# Patient Record
Sex: Female | Born: 2000 | Race: Black or African American | Hispanic: No | Marital: Single | Smoking: Never smoker
Health system: Southern US, Community
[De-identification: ages and names within clinical notes are randomized; demographics above are authoritative.]

## PROBLEM LIST (undated history)

## (undated) DIAGNOSIS — D573 Sickle-cell trait: Secondary | ICD-10-CM

## (undated) DIAGNOSIS — L0291 Cutaneous abscess, unspecified: Secondary | ICD-10-CM

## (undated) HISTORY — DX: Sickle-cell trait: D57.3

## (undated) HISTORY — PX: TONSILLECTOMY: SUR1361

---
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
## 2018-02-18 LAB — HM HIV SCREENING LAB: HM HIV Screening: NEGATIVE

## 2019-03-22 ENCOUNTER — Ambulatory Visit: Payer: Self-pay

## 2019-03-31 ENCOUNTER — Other Ambulatory Visit: Payer: Self-pay

## 2019-03-31 ENCOUNTER — Ambulatory Visit: Payer: Self-pay | Admitting: Physician Assistant

## 2019-03-31 ENCOUNTER — Ambulatory Visit: Payer: Self-pay

## 2019-03-31 DIAGNOSIS — Z202 Contact with and (suspected) exposure to infections with a predominantly sexual mode of transmission: Secondary | ICD-10-CM

## 2019-03-31 DIAGNOSIS — Z113 Encounter for screening for infections with a predominantly sexual mode of transmission: Secondary | ICD-10-CM

## 2019-03-31 LAB — WET PREP FOR TRICH, YEAST, CLUE
Trichomonas Exam: NEGATIVE
Yeast Exam: NEGATIVE

## 2019-03-31 MED ORDER — AZITHROMYCIN 500 MG PO TABS
1000.0000 mg | ORAL_TABLET | Freq: Once | ORAL | Status: AC
  Administered 2019-03-31: 1000 mg via ORAL

## 2019-04-01 ENCOUNTER — Encounter: Payer: Self-pay | Admitting: Physician Assistant

## 2019-04-01 NOTE — Progress Notes (Signed)
[facility] Department STI clinic/screening visit  Subjective:  Sheila Bailey is a 19 y.o. female being seen today for an STI screening visit. The patient reports they do not have symptoms.  Patient reports that they do not desire a pregnancy in the next year.   They reported they are not interested in discussing contraception today.  No LMP recorded.   Patient has the following medical conditions:  There are no problems to display for this patient.   Chief Complaint  Patient presents with  . SEXUALLY TRANSMITTED DISEASE    HPI  Patient reports that she is not having any symptoms but would like a screening.  Reports that she is a contact to Chlamydia.  States that she uses Depo as BCM and does not have periods with the Depo.  See flowsheet for further details and programmatic requirements.    The following portions of the patient's history were reviewed and updated as appropriate: allergies, current medications, past medical history, past social history, past surgical history and problem list.  Objective:  There were no vitals filed for this visit.  Physical Exam Constitutional:      General: She is not in acute distress.    Appearance: Normal appearance. She is normal weight.  HENT:     Head: Normocephalic and atraumatic.     Comments: No nits, lice, or hair loss. No cervical, supraclavicular, or axillary adenopathy.    Mouth/Throat:     Mouth: Mucous membranes are moist.     Pharynx: Oropharynx is clear. No oropharyngeal exudate or posterior oropharyngeal erythema.  Eyes:     Conjunctiva/sclera: Conjunctivae normal.  Pulmonary:     Effort: Pulmonary effort is normal.  Abdominal:     Palpations: Abdomen is soft. There is no mass.     Tenderness: There is no abdominal tenderness. There is no guarding or rebound.  Genitourinary:    General: Normal vulva.     Rectum: Normal.     Comments: External genitalia/pubic area without nits, lice, edema, erythema,  lesions and inguinal adenopathy. Vagina with normal mucosa and discharge. Cervix without visible lesions. Uterus firm, mobile, nt, no masses, no CMT, no adnexal tenderness or fullness. Musculoskeletal:     Cervical back: Neck supple. No tenderness.  Skin:    General: Skin is warm and dry.     Findings: No bruising, erythema, lesion or rash.  Neurological:     Mental Status: She is alert and oriented to person, place, and time.  Psychiatric:        Mood and Affect: Mood normal.        Behavior: Behavior normal.        Thought Content: Thought content normal.        Judgment: Judgment normal.      Assessment and Plan:  Sheila Bailey is a 19 y.o. female presenting to the [facility] Department for STI screening  1. Screening for STD (sexually transmitted disease) Patient into clinic without symptoms. Rec condoms with all sex. Await test results.  Counseled that RN will call if needs to RTC for further treatment once results are back.  - WET PREP FOR TRICH, YEAST, CLUE - Chlamydia/Gonorrhea [place] [facility] - HIV [place] [facility] - Syphilis Serology, [place] [facility]  2. Chlamydia contact Will treat as a contact to Chlamydia with Azithromycin 1 g po DOT today. No sex for 7 days and until after partner completes treatment. RTC for re-treatment if vomits < 2 hr after  taking medicine. - azithromycin (ZITHROMAX) tablet 1,000 mg     No follow-ups on file.  No future appointments.  Matt Holmes, PA

## 2019-04-07 ENCOUNTER — Ambulatory Visit: Payer: Self-pay

## 2019-04-07 ENCOUNTER — Other Ambulatory Visit: Payer: Self-pay

## 2019-04-07 DIAGNOSIS — Z202 Contact with and (suspected) exposure to infections with a predominantly sexual mode of transmission: Secondary | ICD-10-CM

## 2019-04-07 MED ORDER — AZITHROMYCIN 500 MG PO TABS
1000.0000 mg | ORAL_TABLET | Freq: Once | ORAL | Status: AC
  Administered 2019-04-07: 1000 mg via ORAL

## 2019-04-07 NOTE — Progress Notes (Signed)
Patient vomited within 2 hours after leaving her last visit.  Here for re-treatment d/t contact to chlamydia. Richmond Campbell, RN

## 2019-04-08 ENCOUNTER — Telehealth: Payer: Self-pay

## 2019-04-08 ENCOUNTER — Ambulatory Visit: Payer: Self-pay

## 2019-04-08 DIAGNOSIS — A549 Gonococcal infection, unspecified: Secondary | ICD-10-CM

## 2019-04-10 MED ORDER — CEFTRIAXONE SODIUM 250 MG IJ SOLR
250.0000 mg | Freq: Once | INTRAMUSCULAR | Status: AC
  Administered 2019-04-08: 250 mg via INTRAMUSCULAR

## 2019-04-10 NOTE — Progress Notes (Signed)
Patient received + GC results 04/08/19. On 04/07/19 patient was re-treated for contact to Chlamydia after vomiting medication a week prior.  RN consulted with provider re: tx.  Patient given Ceftriaxone only today (04/08/19) d/t having AZMCN <24 hours. Tolerated tx well Richmond Campbell, RN

## 2019-04-10 NOTE — Telephone Encounter (Signed)
TC to patient. Verified ID via password/SS#. Informed of positive GC and need for tx. Instructed to eat before visit and have partner call for tx appt. Appt scheduled 04/08/19. Patient previously tx'd for contact to chlamydia and re-treated on 04/07/19 after vomiting AZMCN the previous week after her provider visit. Per C. Hampton PA patient only needs ceftriaxone if she returns to clinic within 24 hours AZMCN tx. Sheila Campbell, RN

## 2019-09-09 ENCOUNTER — Ambulatory Visit: Payer: BC Managed Care – PPO

## 2019-09-12 ENCOUNTER — Ambulatory Visit: Payer: BC Managed Care – PPO

## 2019-09-13 ENCOUNTER — Ambulatory Visit: Payer: BC Managed Care – PPO

## 2019-09-13 ENCOUNTER — Ambulatory Visit (LOCAL_COMMUNITY_HEALTH_CENTER): Payer: BC Managed Care – PPO | Admitting: Advanced Practice Midwife

## 2019-09-13 ENCOUNTER — Telehealth: Payer: Self-pay

## 2019-09-13 ENCOUNTER — Encounter: Payer: Self-pay | Admitting: Advanced Practice Midwife

## 2019-09-13 ENCOUNTER — Other Ambulatory Visit: Payer: Self-pay

## 2019-09-13 VITALS — BP 119/60 | Ht 65.0 in | Wt 156.8 lb

## 2019-09-13 DIAGNOSIS — A549 Gonococcal infection, unspecified: Secondary | ICD-10-CM | POA: Insufficient documentation

## 2019-09-13 DIAGNOSIS — Z3009 Encounter for other general counseling and advice on contraception: Secondary | ICD-10-CM

## 2019-09-13 DIAGNOSIS — Z30013 Encounter for initial prescription of injectable contraceptive: Secondary | ICD-10-CM

## 2019-09-13 DIAGNOSIS — B9689 Other specified bacterial agents as the cause of diseases classified elsewhere: Secondary | ICD-10-CM

## 2019-09-13 DIAGNOSIS — J45909 Unspecified asthma, uncomplicated: Secondary | ICD-10-CM

## 2019-09-13 DIAGNOSIS — E663 Overweight: Secondary | ICD-10-CM | POA: Insufficient documentation

## 2019-09-13 LAB — WET PREP FOR TRICH, YEAST, CLUE
Trichomonas Exam: NEGATIVE
Yeast Exam: NEGATIVE

## 2019-09-13 MED ORDER — MEDROXYPROGESTERONE ACETATE 150 MG/ML IM SUSP
150.0000 mg | Freq: Once | INTRAMUSCULAR | Status: AC
  Administered 2019-09-13: 150 mg via INTRAMUSCULAR

## 2019-09-13 MED ORDER — METRONIDAZOLE 500 MG PO TABS: 500.0000 mg | ORAL_TABLET | Freq: Two times a day (BID) | ORAL | 0 refills | Status: AC

## 2019-09-13 NOTE — Progress Notes (Signed)
Wet mount reviewed, patient treated for BV per SO. Patient given Depo today per provider orders. Next Depo card given and consent signed.Burt Knack, RN

## 2019-09-13 NOTE — Progress Notes (Signed)
Family Planning Visit- Initial Visit  Subjective:  Sheila Bailey is a 19 y.o.  G0P0000 nonsmoker  being seen today for an initial well woman visit and to discuss family planning options.  She is currently using DMPA for pregnancy prevention. Patient reports she does not want a pregnancy in the next year.  Patient has the following medical conditions has Overweight BMI=26.0 on their problem list.  Chief Complaint  Patient presents with  . Contraception    Depo  . Annual Exam    Patient reports last DMPA 06/07/19 at [facility] and wants another DMPA.  +GC 04/08/19.  No LMP on DMPA.  Last sex 08/23/19 without condom; with current partner x 2 mo; 1 partner in last 3 mo; 2 partners in last 12 mo.  Last ETOH 09/04/19 (1 wine cooler) 2x/mo.  Not working and not in school.  Living with her parents.  Asthma dx'd 2 years ago.  Last PE 02/19/18.  +cry 2-3x/wk, sleep wnl, appetite poor, +irritable, -HI/SI.  No formal psych dx but PHQ-9=16 and agrees to see Kathreen Cosier, LCSW.  Patient denies SI/HI but "I had a bad night last night"  Body mass index is 26.09 kg/m. - Patient is eligible for diabetes screening based on BMI and age >49?  not applicable HA1C ordered? not applicable  Patient reports 2 of partners in last year. Desires STI screening?  Yes  Has patient been screened once for HCV in the past?  No  No results found for: HCVAB  Does the patient have current of drug use, have a partner with drug use, and/or has been incarcerated since last result? No  If yes-- Screen for HCV through [facility]   Does the patient meet criteria for HBV testing? No  Criteria:  -Household, sexual or needle sharing contact with HBV -History of drug use -HIV positive -Those with known Hep C   Health Maintenance Due  Topic Date Due  . Hepatitis C Screening  Never done  . CHLAMYDIA SCREENING  Never done  . TETANUS/TDAP  Never done    Review of Systems  All other systems reviewed and are  negative.   The following portions of the patient's history were reviewed and updated as appropriate: allergies, current medications, past family history, past medical history, past social history, past surgical history and problem list. Problem list updated.   See flowsheet for other program required questions.  Objective:   Vitals:   09/13/19 1457  BP: 119/60  Weight: 156 lb 12.8 oz (71.1 kg)  Height: 5\' 5"  (1.651 m)    Physical Exam Constitutional:      Appearance: Normal appearance. She is normal weight.  HENT:     Head: Normocephalic and atraumatic.     Mouth/Throat:     Mouth: Mucous membranes are moist.  Eyes:     Conjunctiva/sclera: Conjunctivae normal.  Cardiovascular:     Rate and Rhythm: Normal rate and regular rhythm.  Pulmonary:     Effort: Pulmonary effort is normal.     Breath sounds: Normal breath sounds.  Chest:     Breasts:        Right: Normal.        Left: Normal.  Abdominal:     Palpations: Abdomen is soft.     Comments: Soft without tenderness, good tone  Genitourinary:    General: Normal vulva.     Exam position: Lithotomy position.     Vagina: Vaginal discharge (moderate white creamy sl malodorous  leukorrhea, ph>4.5) present.     Cervix: Normal.     Uterus: Normal.      Adnexa: Right adnexa normal and left adnexa normal.     Rectum: Normal.     Comments: Perineum with sl hypopigmentation Musculoskeletal:        General: Normal range of motion.     Cervical back: Normal range of motion and neck supple.  Skin:    General: Skin is warm and dry.  Neurological:     Mental Status: She is alert.  Psychiatric:        Mood and Affect: Mood normal.       Assessment and Plan:  Sheila Bailey is a 19 y.o. female presenting to the [facility] Department for an initial well woman exam/family planning visit  Contraception counseling: Reviewed all forms of birth control options in the tiered based approach. available including  abstinence; over the counter/barrier methods; hormonal contraceptive medication including pill, patch, ring, injection,contraceptive implant, ECP; hormonal and nonhormonal IUDs; permanent sterilization options including vasectomy and the various tubal sterilization modalities. Risks, benefits, and typical effectiveness rates were reviewed.  Questions were answered.  Written information was also given to the patient to review.  Patient desires DMPA, this was prescribed for patient. She will follow up in 11-13 weeks for surveillance.  She was told to call with any further questions, or with any concerns about this method of contraception.  Emphasized use of condoms 100% of the time for STI prevention.  Patient was offered ECP. ECP was not accepted by the patient. ECP counseling was not given - see RN documentation  1. Overweight BMI=26.0   2. Family planning Treat wet mount per standing orders Immunization nurse consult Please give primary care MD list to pt Please give pt [name]'s contact info  - WET PREP FOR TRICH, YEAST, CLUE - Chlamydia/Gonorrhea [place] [facility] - Syphilis Serology, [place] [facility] - HIV [place] [facility]  3. Encounter for initial prescription of injectable contraceptive DMPA 150 mg IM q 11-13 wks x 1 year Please counsel on need for backup condoms to decrease STD risk     No follow-ups on file.  No future appointments.  Alberteen Spindle, CNM

## 2019-09-13 NOTE — Progress Notes (Signed)
ROI for last Depo faxed to [facility]. TC to [employer] and spoke with Porfirio Mylar who states patient's last Depo was given 06/07/2019. Patient is 14 weeks since last Depo.Burt Knack, RN

## 2019-09-13 NOTE — Telephone Encounter (Signed)
Client has appt in Nurse Clinic this pm for Depo, but no current physical and no documentation of Depo administration in Epic. Call to client and agreeabe to scheduling appt for physical this pm. Per client, thinks last Depo was in April at [facility]. Jossie Ng, RN

## 2019-09-13 NOTE — Progress Notes (Signed)
Patient here for PE and Depo. Had last Depo in April 2021 at [facility], and states she was told next Depo due between 6/22 and 09/06/2019. Would like to continue Depo at [facility].Marland KitchenBurt Knack, RN

## 2019-12-19 ENCOUNTER — Encounter: Payer: Self-pay | Admitting: Family Medicine

## 2019-12-19 ENCOUNTER — Other Ambulatory Visit: Payer: Self-pay

## 2019-12-19 ENCOUNTER — Ambulatory Visit: Payer: BC Managed Care – PPO

## 2019-12-19 ENCOUNTER — Ambulatory Visit (LOCAL_COMMUNITY_HEALTH_CENTER): Payer: BC Managed Care – PPO | Admitting: Family Medicine

## 2019-12-19 VITALS — BP 131/79 | Ht 64.0 in | Wt 153.0 lb

## 2019-12-19 DIAGNOSIS — Z3009 Encounter for other general counseling and advice on contraception: Secondary | ICD-10-CM | POA: Diagnosis not present

## 2019-12-19 DIAGNOSIS — Z30013 Encounter for initial prescription of injectable contraceptive: Secondary | ICD-10-CM

## 2019-12-19 DIAGNOSIS — Z113 Encounter for screening for infections with a predominantly sexual mode of transmission: Secondary | ICD-10-CM

## 2019-12-19 LAB — HM HEPATITIS C SCREENING LAB: HM Hepatitis Screen: NEGATIVE

## 2019-12-19 LAB — HM HIV SCREENING LAB: HM HIV Screening: NEGATIVE

## 2019-12-19 LAB — WET PREP FOR TRICH, YEAST, CLUE
Trichomonas Exam: NEGATIVE
Yeast Exam: NEGATIVE

## 2019-12-19 MED ORDER — MEDROXYPROGESTERONE ACETATE 150 MG/ML IM SUSP
150.0000 mg | INTRAMUSCULAR | Status: DC
  Administered 2019-12-19 – 2020-06-11 (×3): 150 mg via INTRAMUSCULAR

## 2019-12-19 NOTE — Progress Notes (Signed)
Presents for Depo and STD screen today. Denies symptoms. Results reviewed with provider, no treatment indicated per standing order. Depo given today, tolerated well. Reminder card for next depo given. Sharlyne Pacas, RN

## 2019-12-19 NOTE — Progress Notes (Signed)
Family Planning Visit  Subjective:  Sheila Bailey is a 19 y.o. being seen today for  Chief Complaint  Patient presents with  . Contraception    Pt has Overweight BMI=26.0; Asthma; Gonorrhea 04/08/19; and Cardiac murmur on their problem list.  HPI  Patient reports she is here for STI screen and depo. She is [redacted]w[redacted]d s/p last depo injection. Denies STI symptoms.    No LMP recorded (lmp unknown). Patient has had an injection. BCM: depo Pt desires EC? n/a  Last pap: due age 41.   Updated in Epic Care Gaps.  Last breast exam: 7/21  Patient reports 3 partner(s) in last year. Do they desire STI screening (if no, why not)? yes  Does the patient desire a pregnancy in the next year? no   19 y.o., Body mass index is 26.26 kg/m. - Is patient eligible for HA1C diabetes screening based on BMI and age >14?  no  Has patient been screened once for HCV in the past?  no  No results found for: HCVAB  Does the patient have current of drug use, have a partner with drug use, and/or has been incarcerated since last result? no If yes-- Screen for HCV through [facility]   Does the patient meet criteria for HBV testing? no  Criteria:  -Household, sexual or needle sharing contact with HBV -History of drug use -HIV positive -Those with known Hep C  See flowsheet for other program required questions.   Health Maintenance Due  Topic Date Due  . Hepatitis C Screening  Never done  . CHLAMYDIA SCREENING  Never done  . TETANUS/TDAP  Never done  . INFLUENZA VACCINE  Never done    ROS  See hpi  The following portions of the patient's history were reviewed and updated as appropriate: allergies, current medications, past family history, past medical history, past social history, past surgical history and problem list. Problem list updated.  Objective:   Vitals:   12/19/19 1106  BP: 131/79  Weight: 153 lb (69.4 kg)  Height: 5\' 4"  (1.626 m)     Physical Exam Vitals and nursing note  reviewed.  Constitutional:      Appearance: Normal appearance.  HENT:     Head: Normocephalic and atraumatic.     Mouth/Throat:     Mouth: Mucous membranes are moist.     Pharynx: Oropharynx is clear. No oropharyngeal exudate or posterior oropharyngeal erythema.  Pulmonary:     Effort: Pulmonary effort is normal.  Abdominal:     General: Abdomen is flat.     Palpations: There is no mass.     Tenderness: There is no abdominal tenderness. There is no rebound.  Genitourinary:    General: Normal vulva.     Exam position: Lithotomy position.     Pubic Area: No rash or pubic lice.      Labia:        Right: No rash or lesion.        Left: No rash or lesion.      Vagina: Vaginal discharge (minimal, white, ph<4.5) present. No erythema, bleeding or lesions.     Cervix: No cervical motion tenderness, discharge, friability, lesion or erythema.     Uterus: Normal.      Adnexa: Right adnexa normal and left adnexa normal.     Rectum: Normal.  Lymphadenopathy:     Head:     Right side of head: No preauricular or posterior auricular adenopathy.     Left  side of head: No preauricular or posterior auricular adenopathy.     Cervical: No cervical adenopathy.     Upper Body:     Right upper body: No supraclavicular or axillary adenopathy.     Left upper body: No supraclavicular or axillary adenopathy.     Lower Body: No right inguinal adenopathy. No left inguinal adenopathy.  Skin:    General: Skin is warm and dry.     Findings: No rash.  Neurological:     Mental Status: She is alert and oriented to person, place, and time.        Assessment and Plan:  Sheila Bailey is a 19 y.o. female presenting to the [facility] Department for a well woman exam/family planning visit  Contraception counseling: Reviewed all forms of birth control options in the tiered based approach. available including abstinence; over the counter/barrier methods; hormonal contraceptive medication including  pill, patch, ring, injection,contraceptive implant, ECP; hormonal and nonhormonal IUDs; permanent sterilization options including vasectomy and the various tubal sterilization modalities. Risks, benefits, and typical effectiveness rates were reviewed.  Questions were answered.  Written information was also given to the patient to review.  Patient desires depo, this was prescribed for patient. She will follow up in  3 months for surveillance.  She was told to call with any further questions, or with any concerns about this method of contraception.  Emphasized use of condoms 100% of the time for STI prevention.  Emergency Contraception: n/a  1. Family planning services -Pt to receive depo today. 1 year ordered 09/2019  2. Screening examination for venereal disease -Pt without symptoms. Screenings today as below. Treat wet prep per standing order. -Patient does meet criteria for HepC Screening. Accepts this screenings. -Counseled on warning s/sx and when to seek care. Recommended condom use with all sex and discussed importance of condom use for STI prevention. - WET PREP FOR TRICH, YEAST, CLUE - Chlamydia/Gonorrhea [place] [facility] - HIV/HCV [place] [facility] - Syphilis Serology,  Lab     Return in about 3 months (around 03/20/2020) for Depo.  No future appointments.  Ann Held, PA-C

## 2020-02-29 ENCOUNTER — Ambulatory Visit: Payer: BC Managed Care – PPO | Admitting: Physician Assistant

## 2020-02-29 ENCOUNTER — Other Ambulatory Visit: Payer: Self-pay

## 2020-02-29 ENCOUNTER — Encounter: Payer: Self-pay | Admitting: Physician Assistant

## 2020-02-29 DIAGNOSIS — Z113 Encounter for screening for infections with a predominantly sexual mode of transmission: Secondary | ICD-10-CM

## 2020-02-29 LAB — WET PREP FOR TRICH, YEAST, CLUE
Trichomonas Exam: NEGATIVE
Yeast Exam: NEGATIVE

## 2020-02-29 NOTE — Progress Notes (Signed)
  [facility] Department STI clinic/screening visit  Subjective:  Sheila Bailey is a 19 y.o. female being seen today for an STI screening visit. The patient reports they do not have symptoms.  Patient reports that they do not desire a pregnancy in the next year.   They reported they are not interested in discussing contraception today.  No LMP recorded. Patient has had an injection.   Patient has the following medical conditions:   Patient Active Problem List   Diagnosis Date Noted  . Overweight BMI=26.0 09/13/2019  . Asthma 09/13/2019  . Gonorrhea 04/08/19 09/13/2019  . Cardiac murmur 04/25/2015    Chief Complaint  Patient presents with  . SEXUALLY TRANSMITTED DISEASE    screening    HPI  Patient reports that she is not having any symptoms but would like a screening today.  Denies chronic conditions and regular medicines.  States last HIV test was 2 months ago and has not yet had a pap since she just turned 19 last month.   See flowsheet for further details and programmatic requirements.    The following portions of the patient's history were reviewed and updated as appropriate: allergies, current medications, past medical history, past social history, past surgical history and problem list.  Objective:  There were no vitals filed for this visit.  Physical Exam Constitutional:      General: She is not in acute distress.    Appearance: Normal appearance.  HENT:     Head: Normocephalic and atraumatic.  Eyes:     Conjunctiva/sclera: Conjunctivae normal.  Pulmonary:     Effort: Pulmonary effort is normal.  Skin:    General: Skin is warm and dry.     Findings: No bruising, erythema, lesion or rash.  Neurological:     Mental Status: She is alert and oriented to person, place, and time.  Psychiatric:        Mood and Affect: Mood normal.        Behavior: Behavior normal.        Thought Content: Thought content normal.        Judgment: Judgment normal.       Assessment and Plan:  Sheila Bailey is a 19 y.o. female presenting to the [facility] Department for STI screening  1. Screening for STD (sexually transmitted disease) Patient into clinic without symptoms. Patient declines provider exam and self-collects vaginal samples.  Counseled how to collect for accurate results. Rec condoms with all sex. Await test results.  Counseled that RN will call if needs to RTC for treatment once results are back. - WET PREP FOR TRICH, YEAST, CLUE - Chlamydia/Gonorrhea [place] [facility] - HIV [employer] [facility] - Syphilis Serology, [place] [facility]     No follow-ups on file.  Future Appointments  Date Time Provider Department Center  03/05/2020 11:00 AM [facility] NURSE [facility] None    Matt Holmes, [place]

## 2020-02-29 NOTE — Progress Notes (Signed)
Post:  RN reviewed wet mount with patient. No tx per S.O. Provider orders complete.   Clary Meeker, RN  

## 2020-03-05 ENCOUNTER — Ambulatory Visit: Payer: BC Managed Care – PPO

## 2020-03-07 ENCOUNTER — Other Ambulatory Visit: Payer: Self-pay

## 2020-03-07 ENCOUNTER — Ambulatory Visit (LOCAL_COMMUNITY_HEALTH_CENTER): Payer: BC Managed Care – PPO

## 2020-03-07 ENCOUNTER — Encounter: Payer: Self-pay | Admitting: Student

## 2020-03-07 VITALS — BP 124/84 | Ht 64.0 in | Wt 154.5 lb

## 2020-03-07 DIAGNOSIS — Z3042 Encounter for surveillance of injectable contraceptive: Secondary | ICD-10-CM

## 2020-03-07 DIAGNOSIS — Z3009 Encounter for other general counseling and advice on contraception: Secondary | ICD-10-CM | POA: Diagnosis not present

## 2020-03-07 LAB — HM HIV SCREENING LAB: HM HIV Screening: NEGATIVE

## 2020-03-07 NOTE — Progress Notes (Signed)
11 weeks 2 days post depo. Voices no prob with depo. Depo 150mg  IM given LUOQ per order by , PA-C dated 12/19/2019. Tolerated well. Next depo due 05/23/2020, pt has reminder. 05/25/2020, RN

## 2020-03-12 ENCOUNTER — Telehealth: Payer: Self-pay | Admitting: Family Medicine

## 2020-03-12 NOTE — Telephone Encounter (Signed)
WANTS TR °

## 2020-03-13 NOTE — Telephone Encounter (Signed)
Returned call to pt at phone # provided. Pt states she would like to know her TR's from 02/29/2020 appt. Verified I was speaking with pt, with pt's name, DOB and password. Counseled pt regarding her negative TR's for GC/Chlamydia, HIV and syphilis and pt states understanding. Offered TR appt if pt needs copies of TR's, but pt declines TR appt at this time. Pt with no further questions or concerns at this time.

## 2020-06-07 ENCOUNTER — Ambulatory Visit: Payer: BC Managed Care – PPO

## 2020-06-11 ENCOUNTER — Ambulatory Visit: Payer: BC Managed Care – PPO

## 2020-06-11 ENCOUNTER — Other Ambulatory Visit: Payer: Self-pay

## 2020-06-11 ENCOUNTER — Ambulatory Visit (LOCAL_COMMUNITY_HEALTH_CENTER): Payer: BC Managed Care – PPO | Admitting: Family Medicine

## 2020-06-11 ENCOUNTER — Encounter: Payer: Self-pay | Admitting: Family Medicine

## 2020-06-11 VITALS — BP 129/76 | Ht 65.0 in | Wt 165.4 lb

## 2020-06-11 DIAGNOSIS — B9689 Other specified bacterial agents as the cause of diseases classified elsewhere: Secondary | ICD-10-CM

## 2020-06-11 DIAGNOSIS — N760 Acute vaginitis: Secondary | ICD-10-CM

## 2020-06-11 DIAGNOSIS — Z30013 Encounter for initial prescription of injectable contraceptive: Secondary | ICD-10-CM

## 2020-06-11 DIAGNOSIS — Z3042 Encounter for surveillance of injectable contraceptive: Secondary | ICD-10-CM

## 2020-06-11 DIAGNOSIS — Z3009 Encounter for other general counseling and advice on contraception: Secondary | ICD-10-CM

## 2020-06-11 DIAGNOSIS — Z113 Encounter for screening for infections with a predominantly sexual mode of transmission: Secondary | ICD-10-CM

## 2020-06-11 LAB — WET PREP FOR TRICH, YEAST, CLUE
Trichomonas Exam: NEGATIVE
Yeast Exam: NEGATIVE

## 2020-06-11 MED ORDER — METRONIDAZOLE 500 MG PO TABS: 500.0000 mg | ORAL_TABLET | Freq: Two times a day (BID) | ORAL | 0 refills | Status: AC

## 2020-06-11 NOTE — Progress Notes (Signed)
Patient here for Depo and STD testing. Declines self-testing. Last Depo was 03/07/2020, 13 5/7 since last Depo. Depo order from 12/19/2019, PE with J. Staples.Marland KitchenMarland KitchenBurt Knack, RN

## 2020-06-11 NOTE — Progress Notes (Signed)
[facility] problem visit  [facility]  Subjective:  Sheila Bailey is a 20 y.o. being seen today for   Chief Complaint  Patient presents with  . sti screening    Patient in clinic today desiring depo and STI check.  Denies s/sx of STI, denies any problems with Depo, but is thinking about changing method.       Does the patient have a current or past history of drug use? No   No components found for: HCV]   Health Maintenance Due  Topic Date Due  . HPV VACCINES (1 - 2-dose series) Never done  . CHLAMYDIA SCREENING  Never done  . TETANUS/TDAP  Never done  . PAP-Cervical Cytology Screening  Never done  . PAP SMEAR-Modifier  Never done    Review of Systems  Constitutional: Negative for chills, fever, malaise/fatigue and weight loss.  HENT: Negative for congestion, hearing loss and sore throat.   Eyes: Negative for blurred vision, double vision and photophobia.  Respiratory: Negative for shortness of breath.   Cardiovascular: Negative for chest pain.  Gastrointestinal: Negative for abdominal pain, blood in stool, constipation, diarrhea, heartburn, nausea and vomiting.  Genitourinary: Negative for dysuria and frequency.  Musculoskeletal: Negative for back pain, joint pain and neck pain.  Skin: Negative for itching and rash.  Neurological: Negative for dizziness, weakness and headaches.  Endo/Heme/Allergies: Does not bruise/bleed easily.  Psychiatric/Behavioral: Negative for depression, substance abuse and suicidal ideas.    The following portions of the patient's history were reviewed and updated as appropriate: allergies, current medications, past family history, past medical history, past social history, past surgical history and problem list. Problem list updated.   See flowsheet for other program required questions.  Objective:   Vitals:   06/11/20 0943  BP: 129/76  Weight: 165 lb 6.4 oz (75 kg)  Height: 5\' 5"  (1.651 m)     Physical Exam Nursing note reviewed.  Constitutional:      Appearance: Normal appearance.  HENT:     Head: Normocephalic and atraumatic.     Mouth/Throat:     Mouth: Mucous membranes are moist.     Pharynx: Oropharynx is clear. No oropharyngeal exudate or posterior oropharyngeal erythema.  Pulmonary:     Effort: Pulmonary effort is normal.  Chest:  Breasts:     Right: No axillary adenopathy or supraclavicular adenopathy.     Left: No axillary adenopathy or supraclavicular adenopathy.    Abdominal:     General: Abdomen is flat.     Palpations: There is no mass.     Tenderness: There is no abdominal tenderness. There is no rebound.  Genitourinary:    Exam position: Lithotomy position.     Pubic Area: No rash or pubic lice.      Labia:        Right: No rash or lesion.        Left: No rash or lesion.      Vagina: Normal. No vaginal discharge, erythema, bleeding or lesions.     Cervix: No cervical motion tenderness, discharge, friability, lesion or erythema.     Uterus: Normal.      Adnexa: Right adnexa normal and left adnexa normal.     Comments: Deferred patient self collected  Lymphadenopathy:     Head:     Right side of head: No preauricular or posterior auricular adenopathy.     Left side of head: No preauricular or posterior auricular adenopathy.  Cervical: No cervical adenopathy.     Upper Body:     Right upper body: No supraclavicular or axillary adenopathy.     Left upper body: No supraclavicular or axillary adenopathy.     Lower Body: No right inguinal adenopathy. No left inguinal adenopathy.  Skin:    General: Skin is warm and dry.     Findings: No rash.  Neurological:     Mental Status: She is alert and oriented to person, place, and time.  Psychiatric:        Mood and Affect: Mood normal.        Behavior: Behavior normal.       Assessment and Plan:  Sheila Bailey is a 20 y.o. female presenting to the [facility] Department for a  Women's Health problem visit  1. Screening examination for venereal disease  - Chlamydia/Gonorrhea [place] [facility] - HIV [place] [facility] - Syphilis Serology, [place] [facility] - WET PREP FOR TRICH, YEAST, CLUE  Patient accepted all screenings including wet prep, vaginal CT/GC and bloodwork for HIV/RPR.  Patient meets criteria for HepB screening? No. Ordered? No - does not meet crtieria  Patient meets criteria for HepC screening? No. Ordered? No - does not meet criteira    RN: Treatment needed if + amine or + Clue on wet prep.   Treat with Metronidazole 500 Mg PO BID X 7 days.   Discussed time line for State Lab results and that patient will be called with positive results and encouraged patient to call if she had not heard in 2 weeks.  Counseled to return or seek care for continued or worsening symptoms Recommended condom use with all sex  Patient is currently using Depo Provera  to prevent pregnancy.   2. Encounter for surveillance of injectable contraceptive  Ok to give Depo per 12/19/2019 order.  Give patient [facility] brochure, patient is thinking about changing BCM.  Currently unsure.       Return in about 11 weeks (around 08/27/2020) for Depo, Physical exam, as needed.  No future appointments.  Wendi Snipes, FNP

## 2020-06-11 NOTE — Progress Notes (Signed)
Wet mount reviewed, patient treated for BV per SO. Provider aware of results. Depo given, right glute, per 09/13/2019 order for Depo x 1 year. Next Depo due reminder card given, and patient states she may want to change from Depo to OC. Patient counseled to schedule PE and [facility] appointment in mid -July. Patient states understanding.Burt Knack, RN

## 2020-06-14 LAB — HM HIV SCREENING LAB: HM HIV Screening: NEGATIVE

## 2020-06-20 ENCOUNTER — Telehealth: Payer: Self-pay | Admitting: Family Medicine

## 2020-06-20 NOTE — Telephone Encounter (Signed)
Pt called to speak with nurse about test results. Says she tried to view results on MyChart but is unable to.

## 2020-10-09 ENCOUNTER — Ambulatory Visit: Payer: BC Managed Care – PPO

## 2020-10-16 ENCOUNTER — Ambulatory Visit (LOCAL_COMMUNITY_HEALTH_CENTER): Payer: Self-pay | Admitting: Family Medicine

## 2020-10-16 ENCOUNTER — Other Ambulatory Visit: Payer: Self-pay

## 2020-10-16 ENCOUNTER — Ambulatory Visit: Payer: Self-pay

## 2020-10-16 ENCOUNTER — Encounter: Payer: Self-pay | Admitting: Family Medicine

## 2020-10-16 VITALS — BP 128/77 | Ht 64.0 in | Wt 165.0 lb

## 2020-10-16 DIAGNOSIS — Z01419 Encounter for gynecological examination (general) (routine) without abnormal findings: Secondary | ICD-10-CM

## 2020-10-16 DIAGNOSIS — Z3009 Encounter for other general counseling and advice on contraception: Secondary | ICD-10-CM

## 2020-10-16 DIAGNOSIS — Z30011 Encounter for initial prescription of contraceptive pills: Secondary | ICD-10-CM

## 2020-10-16 DIAGNOSIS — Z1272 Encounter for screening for malignant neoplasm of vagina: Secondary | ICD-10-CM

## 2020-10-16 DIAGNOSIS — Z113 Encounter for screening for infections with a predominantly sexual mode of transmission: Secondary | ICD-10-CM

## 2020-10-16 LAB — WET PREP FOR TRICH, YEAST, CLUE: Trichomonas Exam: NEGATIVE

## 2020-10-16 MED ORDER — NORGESTIM-ETH ESTRAD TRIPHASIC 0.18/0.215/0.25 MG-35 MCG PO TABS: 1.0000 | ORAL_TABLET | Freq: Every day | ORAL | 12 refills | Status: DC

## 2020-10-16 NOTE — Progress Notes (Signed)
Here today for STD screening and to change birth control. Last PE here was 09/13/2019. No Pap Smear records in Epic. Last Depo was 06/11/2020 (18.1 weeks.) Declines bloodwork for STD screening. Wants OCP's for birth control. Tawny Hopping, RN

## 2020-10-16 NOTE — Progress Notes (Signed)
Wet Mount results reviewed. Per standing orders no treatment indicated. Alan Drummer, RN  

## 2020-10-16 NOTE — Progress Notes (Signed)
Family Planning Visit- Repeat Yearly Visit  Subjective:  Sheila Bailey is a 20 y.o. G0P0000  being seen today for an annual wellness visit and to discuss contraception options.   The patient is currently using Depo Provera for pregnancy prevention. Patient does not want a pregnancy in the next year. Patient has the following medical problems: has Overweight BMI=26.0; Asthma; Gonorrhea 04/08/19; and Cardiac murmur on their problem list.  Chief Complaint  Patient presents with   Contraception   Gynecologic Exam    PE and birth control    Patient reports her for physical, pap , STI check and to change BCM from depo to OCP.    Patient denies having any s/sx    See flowsheet for other program required questions.   There is no height or weight on file to calculate BMI. - Patient is eligible for diabetes screening based on BMI and age >28?  not applicable HA1C ordered? not applicable  Patient reports 3 of partners in last year. Desires STI screening?  Yes   Has patient been screened once for HCV in the past?  No  No results found for: HCVAB  Does the patient have current of drug use, have a partner with drug use, and/or has been incarcerated since last result? No  If yes-- Screen for HCV through [facility]   Does the patient meet criteria for HBV testing? No  Criteria:  -Household, sexual or needle sharing contact with HBV -History of drug use -HIV positive -Those with known Hep C   Health Maintenance Due  Topic Date Due   HPV VACCINES (1 - 2-dose series) Never done   CHLAMYDIA SCREENING  Never done   TETANUS/TDAP  Never done   PAP-Cervical Cytology Screening  Never done   PAP SMEAR-Modifier  Never done   INFLUENZA VACCINE  10/01/2020    Review of Systems  Constitutional:  Negative for chills, fever, malaise/fatigue and weight loss.  HENT:  Negative for congestion, hearing loss and sore throat.   Eyes:  Negative for blurred vision, double vision and photophobia.   Respiratory:  Negative for shortness of breath.   Cardiovascular:  Negative for chest pain.  Gastrointestinal:  Negative for abdominal pain, blood in stool, constipation, diarrhea, heartburn, nausea and vomiting.  Genitourinary:  Negative for dysuria and frequency.  Musculoskeletal:  Negative for back pain, joint pain and neck pain.  Skin:  Negative for itching and rash.  Neurological:  Negative for dizziness, weakness and headaches.  Endo/Heme/Allergies:  Does not bruise/bleed easily.  Psychiatric/Behavioral:  Negative for depression, substance abuse and suicidal ideas.    The following portions of the patient's history were reviewed and updated as appropriate: allergies, current medications, past family history, past medical history, past social history, past surgical history and problem list. Problem list updated.  Objective:  There were no vitals filed for this visit.  Physical Exam Vitals and nursing note reviewed.  Constitutional:      Appearance: Normal appearance.  HENT:     Head: Normocephalic and atraumatic.     Mouth/Throat:     Mouth: Mucous membranes are moist.     Dentition: Normal dentition. No dental caries.     Pharynx: No oropharyngeal exudate or posterior oropharyngeal erythema.  Eyes:     General: No scleral icterus. Neck:     Thyroid: No thyroid mass, thyromegaly or thyroid tenderness.  Cardiovascular:     Rate and Rhythm: Normal rate.     Pulses: Normal pulses.  Pulmonary:  Effort: Pulmonary effort is normal.  Abdominal:     General: Abdomen is flat. Bowel sounds are normal.     Palpations: Abdomen is soft.  Genitourinary:    General: Normal vulva.     Rectum: Normal.     Comments: External genitalia without, lice, nits, erythema, edema , lesions or inguinal adenopathy. Vagina with normal mucosa and discharge and pH equals 4.  Cervix without visual lesions, uterus firm, mobile, non-tender, no masses, CMT adnexal fullness or tenderness.    Musculoskeletal:        General: Normal range of motion.     Cervical back: Normal range of motion and neck supple.  Skin:    General: Skin is warm and dry.  Neurological:     General: No focal deficit present.     Mental Status: She is alert and oriented to person, place, and time.  Psychiatric:        Mood and Affect: Mood normal.        Behavior: Behavior normal.      Assessment and Plan:  Sheila Bailey is a 20 y.o. female G0P0000 presenting to the [facility] Department for an yearly wellness and contraception visit  Contraception counseling: Reviewed all forms of birth control options in the tiered based approach. available including abstinence; over the counter/barrier methods; hormonal contraceptive medication including pill, patch, ring, injection,contraceptive implant, ECP; hormonal and nonhormonal IUDs; permanent sterilization options including vasectomy and the various tubal sterilization modalities. Risks, benefits, and typical effectiveness rates were reviewed.  Questions were answered.  Written information was also given to the patient to review.  Patient desires OCP, this was prescribed for patient. She will follow up as needed for surveillance.  She was told to call with any further questions, or with any concerns about this method of contraception.  Emphasized use of condoms 100% of the time for STI prevention.  Patient was not offered ECP based on current BCM.   1. Smear, vaginal, as part of routine gynecological examination Well woman exam, CBE and PAP today   - IGP, Aptima HPV  2. Screening examination for venereal disease  - Chlamydia/Gonorrhea [place] [facility] - Gonococcus culture - Syphilis Serology, [place] [facility] - WET PREP FOR TRICH, YEAST, CLUE - HIV [place] [facility]  Patient accepted all screenings including wet prep, oral, vaginal CT/GC and bloodwork for HIV/RPR.  Patient meets criteria for HepB screening? No. Ordered? No - does not meet  criteria  Patient meets criteria for HepC screening? No. Ordered? No - does not meet criteria   Wet prep results neg    No Treatment needed  Discussed time line for State Lab results and that patient will be called with positive results and encouraged patient to call if she had not heard in 2 weeks.  Counseled to return or seek care for continued or worsening symptoms Recommended condom use with all sex  Patient is currently using  Tri-sprintec  to prevent pregnancy.   3. Encounter for initial prescription of contraceptive pills  - Norgestimate-Ethinyl Estradiol Triphasic (TRI-SPRINTEC) 0.18/0.215/0.25 MG-35 MCG tablet; Take 1 tablet by mouth daily.  Dispense: 28 tablet; Refill: 12   No follow-ups on file.  No future appointments.  Wendi Snipes, FNP

## 2020-10-18 LAB — IGP, APTIMA HPV
HPV Aptima: NEGATIVE
PAP Smear Comment: 0

## 2020-10-18 LAB — HM HIV SCREENING LAB: HM HIV Screening: NEGATIVE

## 2020-10-21 LAB — GONOCOCCUS CULTURE

## 2021-01-10 ENCOUNTER — Ambulatory Visit (LOCAL_COMMUNITY_HEALTH_CENTER): Payer: BC Managed Care – PPO | Admitting: Physician Assistant

## 2021-01-10 ENCOUNTER — Other Ambulatory Visit: Payer: Self-pay

## 2021-01-10 ENCOUNTER — Encounter: Payer: Self-pay | Admitting: Physician Assistant

## 2021-01-10 VITALS — BP 123/71 | Ht 65.0 in | Wt 168.8 lb

## 2021-01-10 DIAGNOSIS — Z3041 Encounter for surveillance of contraceptive pills: Secondary | ICD-10-CM

## 2021-01-10 DIAGNOSIS — Z113 Encounter for screening for infections with a predominantly sexual mode of transmission: Secondary | ICD-10-CM

## 2021-01-10 DIAGNOSIS — Z3009 Encounter for other general counseling and advice on contraception: Secondary | ICD-10-CM

## 2021-01-10 LAB — WET PREP FOR TRICH, YEAST, CLUE
Trichomonas Exam: NEGATIVE
Yeast Exam: NEGATIVE

## 2021-01-10 MED ORDER — NORETHINDRONE ACET-ETHINYL EST 1-20 MG-MCG PO TABS: 1.0000 | ORAL_TABLET | Freq: Every day | ORAL | 9 refills | Status: DC

## 2021-01-10 NOTE — Progress Notes (Signed)
[facility]  Subjective:  Sheila Bailey is a 20 y.o. being seen today to discuss birth control change and requests STD screening.  Chief Complaint  Patient presents with   Contraception    HPI Patient states that she took the OCP that she was prescribed at her annual visit for about 1.5 weeks and had "severe breast pain and emotional swings."  States that she stopped the OCP at that point and does not want to go back to Depo but would like to try a different OCP.  Denies any vaginal symptoms but would like a screening today.   Does the patient have a current or past history of drug use? No   No components found for: HCV]   Health Maintenance Due  Topic Date Due   Pneumococcal Vaccine 64-24 Years old (1 - PCV) Never done   HPV VACCINES (1 - 2-dose series) Never done   CHLAMYDIA SCREENING  Never done   TETANUS/TDAP  Never done   PAP-Cervical Cytology Screening  Never done   INFLUENZA VACCINE  Never done    Review of Systems  All other systems reviewed and are negative.  The following portions of the patient's history were reviewed and updated as appropriate: allergies, current medications, past family history, past medical history, past social history, past surgical history and problem list. Problem list updated.   See flowsheet for other program required questions.  Objective:   Vitals:   01/10/21 0923  BP: 123/71  Weight: 168 lb 12.8 oz (76.6 kg)  Height: 5\' 5"  (1.651 m)    Physical Exam Vitals reviewed.  Constitutional:      General: She is not in acute distress.    Appearance: Normal appearance.  HENT:     Head: Normocephalic and atraumatic.  Eyes:     Conjunctiva/sclera: Conjunctivae normal.  Pulmonary:     Effort: Pulmonary effort is normal.  Skin:    General: Skin is warm and dry.  Neurological:     Mental Status: She is alert and oriented to person, place, and time.  Psychiatric:         Mood and Affect: Mood normal.        Behavior: Behavior normal.        Thought Content: Thought content normal.        Judgment: Judgment normal.      Assessment and Plan:  Sheila Bailey is a 19 y.o. female presenting to the [facility] Department for a Women's Health problem visit  1. Encounter for counseling regarding contraception Reviewed with patient re: BCM options. Reviewed with patient normal SE of OCP and when to call clinic with concerns. Enc condoms with all sex for STD protection.   2. Screening for STD (sexually transmitted disease) Patient into clinic without symptoms. Patient requests to self-collect vaginal samples for testing today.  Counseled patient how to collect for accurate results.  Await test results.  Counseled that RN will call if needs to RTC for treatment once results are back.  - WET PREP FOR TRICH, YEAST, CLUE - Chlamydia/Gonorrhea [place] [facility] - HIV [place] [facility] - Syphilis Serology, [place] [facility]  3. Surveillance of previously prescribed contraceptive pill Will send Rx for Microgestin 1/20 28 d take 1 po daily at the same time each day with refills to last until RP is due 10/2021. Rec that patient take Vitamin E 400 IU 1 po daily to help  reduce breast tenderness with restarting OCP. Call with questions or concerns. - norethindrone-ethinyl estradiol (MICROGESTIN) 1-20 MG-MCG tablet; Take 1 tablet by mouth daily.  Dispense: 28 tablet; Refill: 9     No follow-ups on file.  No future appointments.  Matt Holmes, PA

## 2021-04-26 ENCOUNTER — Ambulatory Visit: Payer: BC Managed Care – PPO

## 2021-05-06 ENCOUNTER — Encounter (HOSPITAL_COMMUNITY): Payer: Self-pay

## 2021-05-06 ENCOUNTER — Emergency Department (HOSPITAL_COMMUNITY)
Admission: EM | Admit: 2021-05-06 | Discharge: 2021-05-06 | Disposition: A | Discharge status: Home / Self Care | Payer: BC Managed Care – PPO | Attending: Emergency Medicine | Admitting: Emergency Medicine

## 2021-05-06 DIAGNOSIS — S0990XA Unspecified injury of head, initial encounter: Secondary | ICD-10-CM | POA: Diagnosis present

## 2021-05-06 DIAGNOSIS — Y92410 Unspecified street and highway as the place of occurrence of the external cause: Secondary | ICD-10-CM | POA: Insufficient documentation

## 2021-05-06 DIAGNOSIS — S0083XA Contusion of other part of head, initial encounter: Secondary | ICD-10-CM

## 2021-05-06 MED ORDER — ACETAMINOPHEN 325 MG PO TABS
650.0000 mg | ORAL_TABLET | Freq: Four times a day (QID) | ORAL | Status: DC | PRN
  Administered 2021-05-06: 650 mg via ORAL
  Filled 2021-05-06: qty 2

## 2021-05-06 MED ORDER — ONDANSETRON 4 MG PO TBDP
4.0000 mg | ORAL_TABLET | Freq: Once | ORAL | Status: AC
Start: 2021-05-06 — End: 2021-05-06
  Administered 2021-05-06: 4 mg via ORAL
  Filled 2021-05-06: qty 1
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
## 2021-05-06 NOTE — Discharge Instructions (Signed)
Return if any problems.

## 2021-05-08 ENCOUNTER — Ambulatory Visit: Payer: BC Managed Care – PPO

## 2021-08-16 ENCOUNTER — Encounter: Payer: Self-pay | Admitting: Emergency Medicine

## 2021-08-16 ENCOUNTER — Ambulatory Visit
Admission: EM | Admit: 2021-08-16 | Discharge: 2021-08-16 | Disposition: A | Discharge status: Home / Self Care | Payer: BC Managed Care – PPO

## 2021-08-16 DIAGNOSIS — N898 Other specified noninflammatory disorders of vagina: Secondary | ICD-10-CM

## 2021-08-16 DIAGNOSIS — L739 Follicular disorder, unspecified: Secondary | ICD-10-CM

## 2021-08-16 NOTE — ED Provider Notes (Signed)
[facility]    CSN: [id number] Arrival date & time: 08/16/21  1138      History   Chief Complaint Chief Complaint  Patient presents with   Rash    Entered by patient    HPI Sheila Bailey is a 21 y.o. female.  Patient presents with 3-day history of vaginal dryness and irritation.  She is concerned that she may have a rash in her vaginal area.  She denies vaginal discharge, pelvic pain, abdominal pain, dysuria, flank pain, or other symptoms.  She states she has Sheila appointment at the health department on 08/20/2021.  LMP: 08/06/2021.    The history is provided by the patient and medical records.    Past Medical History:  Diagnosis Date   Abscess    Sickle cell trait [facility])     Patient Active Problem List   Diagnosis Date Noted   Overweight BMI=26.0 09/13/2019   Asthma 09/13/2019   Gonorrhea 04/08/19 09/13/2019   Cardiac murmur 04/25/2015    Past Surgical History:  Procedure Laterality Date   TONSILLECTOMY      OB History     Gravida  0   Para  0   Term  0   Preterm  0   AB  0   Living  0      SAB  0   IAB  0   Ectopic  0   Multiple  0   Live Births  0            Home Medications    Prior to Admission medications   Medication Sig Start Date End Date Taking? Authorizing Provider  norethindrone-ethinyl estradiol (MICROGESTIN) 1-20 MG-MCG tablet Take 1 tablet by mouth daily. 01/10/21   Matt Holmes, PA    Family History Family History  Problem Relation Age of Onset   High Cholesterol Father    Sickle cell trait Father    Hypertension Father    Diabetes Maternal Grandmother    Migraines Mother    Sickle cell anemia Brother    Breast cancer Neg Hx     Social History Social History   Tobacco Use   Smoking status: Never   Smokeless tobacco: Never  Vaping Use   Vaping Use: Former  Substance Use Topics   Alcohol use: Yes    Comment: rarely   Drug use: Never     Allergies   Kiwi extract and Pineapple   Review  of Systems Review of Systems  Constitutional:  Negative for chills and fever.  Gastrointestinal:  Negative for abdominal pain, constipation, diarrhea, nausea and vomiting.  Genitourinary:  Negative for dysuria, flank pain, frequency, hematuria, pelvic pain and vaginal discharge.  Skin:  Negative for color change and rash.  All other systems reviewed and are negative.    Physical Exam Triage Vital Signs ED Triage Vitals  Enc Vitals Group     BP      Pulse      Resp      Temp      Temp src      SpO2      Weight      Height      Head Circumference      Peak Flow      Pain Score      Pain Loc      Pain Edu?      Excl. in GC?    No data found.  Updated Vital Signs BP (!) 153/83  Pulse (!) 104   Temp 98 F (36.7 C)   Resp 18   LMP 08/06/2021   SpO2 97%   Visual Acuity Right Eye Distance:   Left Eye Distance:   Bilateral Distance:    Right Eye Near:   Left Eye Near:    Bilateral Near:     Physical Exam Vitals and nursing note reviewed.  Constitutional:      General: She is not in acute distress.    Appearance: Normal appearance. She is well-developed. She is not ill-appearing.  HENT:     Mouth/Throat:     Mouth: Mucous membranes are moist.  Cardiovascular:     Rate and Rhythm: Normal rate and regular rhythm.  Pulmonary:     Effort: Pulmonary effort is normal. No respiratory distress.  Abdominal:     Palpations: Abdomen is soft.     Tenderness: There is no abdominal tenderness.  Genitourinary:    Comments: Wide spread papular folliculitis on mons pubis.  Scant white vaginal discharge.  Patient declines vaginal swab or swab of follicular rash.   Musculoskeletal:     Cervical back: Neck supple.  Skin:    General: Skin is warm and dry.  Neurological:     Mental Status: She is alert.  Psychiatric:        Mood and Affect: Mood normal.        Behavior: Behavior normal.      UC Treatments / Results  Labs (all labs ordered are listed, but only  abnormal results are displayed) Labs Reviewed - No data to display  EKG   Radiology No results found.  Procedures Procedures (including critical care time)  Medications Ordered in UC Medications - No data to display  Initial Impression / Assessment and Plan / UC Course  I have reviewed the triage vital signs and the nursing notes.  Pertinent labs & imaging results that were available during my care of the patient were reviewed by me and considered in my medical decision making (see chart for details).    Vaginal irritation, folliculitis of perineum.  Patient declines STD testing today.  She is concerned for STD but states she will keep her appointment at the health department next week for testing.  She declines any testing here today.  Instructed patient to follow up as scheduled with health department.    Final Clinical Impressions(s) / UC Diagnoses   Final diagnoses:  Vaginal irritation  Folliculitis of perineum     Discharge Instructions      By your request, no tests were performed today.  Follow up as scheduled with the health department next week.         ED Prescriptions   None    PDMP not reviewed this encounter.   Mickie Bail, NP 08/16/21 1215

## 2021-08-16 NOTE — ED Triage Notes (Signed)
Pt presents with a rash on her vaginal area that come and goes. It typically happens when she starts are menstrual cycle.

## 2021-08-16 NOTE — Discharge Instructions (Addendum)
By your request, no tests were performed today.  Follow up as scheduled with the health department next week.

## 2021-08-20 ENCOUNTER — Ambulatory Visit: Payer: Self-pay | Admitting: Family Medicine

## 2021-08-20 ENCOUNTER — Encounter: Payer: Self-pay | Admitting: Family Medicine

## 2021-08-20 DIAGNOSIS — N926 Irregular menstruation, unspecified: Secondary | ICD-10-CM

## 2021-08-20 DIAGNOSIS — Z113 Encounter for screening for infections with a predominantly sexual mode of transmission: Secondary | ICD-10-CM

## 2021-08-20 DIAGNOSIS — B3731 Acute candidiasis of vulva and vagina: Secondary | ICD-10-CM

## 2021-08-20 LAB — WET PREP FOR TRICH, YEAST, CLUE: Trichomonas Exam: NEGATIVE

## 2021-08-20 LAB — HM HIV SCREENING LAB: HM HIV Screening: NEGATIVE

## 2021-08-20 LAB — PREGNANCY, URINE: Preg Test, Ur: NEGATIVE

## 2021-08-20 MED ORDER — FLUCONAZOLE 150 MG PO TABS: 150.0000 mg | ORAL_TABLET | Freq: Once | ORAL | 1 refills | Status: AC

## 2021-08-20 NOTE — Progress Notes (Unsigned)
Pt here for STD screening.  Wet mount results reviewed.  Provider sent Rx to pt's Pharmacy.  Condoms declined.  Berdie Ogren, RN

## 2021-08-20 NOTE — Progress Notes (Incomplete)
[facility] Department  STI clinic/screening visit [address] [place] [address] [phone]  Subjective:  Sheila Bailey is a 21 y.o. female being seen today for an STI screening visit. The patient reports they {Actions; do/do not:19616} have symptoms.  Patient reports that they {Actions; do/do not:19616} desire a pregnancy in the next year.   They reported they {Actions; are/are not:16769} interested in discussing contraception today.    Patient's last menstrual period was 08/06/2021 (approximate).   Patient has the following medical conditions:   Patient Active Problem List   Diagnosis Date Noted  . Overweight BMI=26.0 09/13/2019  . Asthma 09/13/2019  . Gonorrhea 04/08/19 09/13/2019  . Cardiac murmur 04/25/2015    Chief Complaint  Patient presents with  . SEXUALLY TRANSMITTED DISEASE    Screening    HPI  Patient reports ***  Last HIV test per patient/review of record was 2022 Patient reports last pap was 2022- NIL, next in 3 years.   Screening for MPX risk: Does the patient have an unexplained rash? {yes/no:20286} Is the patient MSM? {yes/no:20286} Does the patient endorse multiple sex partners or anonymous sex partners? {yes/no:20286} Did the patient have close or sexual contact with a person diagnosed with MPX? {yes/no:20286} Has the patient traveled outside the [place] where MPX is endemic? {yes/no:20286} Is there a high clinical suspicion for MPX-- evidenced by one of the following {yes/no:20286}  -Unlikely to be chickenpox  -Lymphadenopathy  -Rash that present in same phase of evolution on any given body part See flowsheet for further details and programmatic requirements.   Immunization history:  Immunization History  Administered Date(s) Administered  . Hepatitis A 10/21/2010, 10/23/2011  . Hepatitis B 2000-03-28, 02/19/1999, 11/29/1999  . Hpv-Unspecified 12/03/2012, 12/05/2013  . Tdap 10/21/2010     The following portions of  the patient's history were reviewed and updated as appropriate: allergies, current medications, past medical history, past social history, past surgical history and problem list.  Objective:  There were no vitals filed for this visit.  Physical Exam   Assessment and Plan:  Sheila Bailey is a 21 y.o. female presenting to the [facility] Department for STI screening  1. Screening examination for venereal disease Patient accepted all screenings including ***oral, vaginal CT/GC and bloodwork for HIV/RPR.  Patient meets criteria for HepB screening? {yes/no:20286}. Ordered? {Yes or If no, why not?:20788} Patient meets criteria for HepC screening? {yes/no:20286}. Ordered? {Yes or If no, why not?:20788}  Treat wet prep per standing order Discussed time line for State Lab results and that patient will be called with positive results and encouraged patient to call if she had not heard in 2 weeks.  Counseled to return or seek care for continued or worsening symptoms Recommended condom use with all sex  Patient is currently using {[facility] Contraception:21020264} to prevent pregnancy.   - HIV [place] [facility] - Syphilis Serology, [place] [facility] - Chlamydia/Gonorrhea [place] [facility] - WET PREP FOR TRICH, YEAST, CLUE      No follow-ups on file.  No future appointments.  Federico Flake, MD

## 2023-01-02 ENCOUNTER — Encounter: Payer: Self-pay | Admitting: Nurse Practitioner

## 2023-01-02 ENCOUNTER — Other Ambulatory Visit: Payer: Self-pay

## 2023-01-02 ENCOUNTER — Ambulatory Visit: Payer: BC Managed Care – PPO | Admitting: Nurse Practitioner

## 2023-01-02 DIAGNOSIS — F419 Anxiety disorder, unspecified: Secondary | ICD-10-CM

## 2023-01-02 DIAGNOSIS — Z113 Encounter for screening for infections with a predominantly sexual mode of transmission: Secondary | ICD-10-CM

## 2023-01-02 LAB — WET PREP FOR TRICH, YEAST, CLUE
Trichomonas Exam: NEGATIVE
Yeast Exam: NEGATIVE

## 2023-01-02 LAB — HM HIV SCREENING LAB: HM HIV Screening: NEGATIVE

## 2023-01-02 NOTE — Progress Notes (Unsigned)
[facility] Department  STI clinic/screening visit [address] [place] [address] [phone]  Subjective:  Sheila Bailey is a 22 y.o. female being seen today for an STI screening visit. The patient reports they do not have symptoms.  Patient reports that they do not desire a pregnancy in the next year.   They reported they are not interested in discussing contraception today.    Patient's last menstrual period was 12/30/2022 (exact date).  Patient has the following medical conditions:   Patient Active Problem List   Diagnosis Date Noted   Overweight BMI=26.0 09/13/2019   Asthma 09/13/2019   Gonorrhea 04/08/19 09/13/2019   Cardiac murmur 04/25/2015    Chief Complaint  Patient presents with   SEXUALLY TRANSMITTED DISEASE    Screening    Patient is a 22 y.o. pleasant female who presents to the clinic today for asymptomatic STI screening. She reports no sex in over a year and when she has been sexually active in the past it was with female only partners and included oral and vaginal sex. She is not currently using contraception and previously when sexually active used condoms sometimes and has previously been on OCP. She reports a history of gonorrhea and chlamydia in 2019. Of note, last PAP was 10/16/20 (NILM, HPV-).    Does the patient using douching products? No  Last HIV test per patient/review of record was  Lab Results  Component Value Date   HMHIVSCREEN Negative - Validated 08/20/2021    Last HEPC test per patient/review of record was  Lab Results  Component Value Date   HMHEPCSCREEN Negative-Validated 12/19/2019    Last HEPB test per patient/review of record was No components found for: "HMHEPBSCREEN" No components found for: "HEPC"   Patient reports last pap was  Lab Results  Component Value Date   SPECADGYN Comment 10/16/2020    Screening for MPX risk: Does the patient have an unexplained rash? No Is the patient MSM? No Does the  patient endorse multiple sex partners or anonymous sex partners? No Did the patient have close or sexual contact with a person diagnosed with MPX? No Has the patient traveled outside the [place] where MPX is endemic? No Is there a high clinical suspicion for MPX-- evidenced by one of the following No  -Unlikely to be chickenpox  -Lymphadenopathy  -Rash that present in same phase of evolution on any given body part See flowsheet for further details and programmatic requirements.   Immunization history:  Immunization History  Administered Date(s) Administered   Hepatitis A 10/21/2010, 10/23/2011   Hepatitis B 03-02-1999, 02/19/1999, 11/29/1999   Hpv-Unspecified 12/03/2012, 12/05/2013   Tdap 10/21/2010     The following portions of the patient's history were reviewed and updated as appropriate: allergies, current medications, past medical history, past social history, past surgical history and problem list.  Objective:  There were no vitals filed for this visit.  Physical Exam Nursing note reviewed. Exam conducted with a chaperone present Larry Sierras, [employer] present as chaperone during exam.).  Constitutional:      Appearance: Normal appearance.  HENT:     Head: Normocephalic.     Salivary Glands: Right salivary gland is not diffusely enlarged or tender. Left salivary gland is not diffusely enlarged or tender.     Mouth/Throat:     Lips: Pink. No lesions.     Mouth: Mucous membranes are moist.     Tongue: No lesions. Tongue does not deviate from midline.  Pharynx: Oropharynx is clear. Uvula midline. No oropharyngeal exudate or posterior oropharyngeal erythema.     Tonsils: No tonsillar exudate.  Eyes:     General: No scleral icterus.       Right eye: No discharge.        Left eye: No discharge.     Conjunctiva/sclera: Conjunctivae normal.  Pulmonary:     Effort: Pulmonary effort is normal.  Abdominal:     General: Abdomen is flat. There is no distension.     Palpations:  Abdomen is soft. There is no mass.     Tenderness: There is no abdominal tenderness. There is no right CVA tenderness, left CVA tenderness, guarding or rebound.  Genitourinary:    General: Normal vulva.     Exam position: Lithotomy position.     Pubic Area: No rash or pubic lice.      Tanner stage (genital): 5.     Labia:        Right: No rash or lesion.        Left: No rash or lesion.      Vagina: Normal. No vaginal discharge, erythema, bleeding or lesions.     Cervix: No cervical motion tenderness, discharge, friability, lesion or erythema.     Uterus: Normal.      Adnexa: Right adnexa normal and left adnexa normal.     Comments: pH = <4.5 Lymphadenopathy:     Head:     Right side of head: No submental, submandibular, tonsillar, preauricular or posterior auricular adenopathy.     Left side of head: No submental, submandibular, tonsillar, preauricular or posterior auricular adenopathy.     Cervical: No cervical adenopathy.     Right cervical: No superficial or posterior cervical adenopathy.    Left cervical: No superficial or posterior cervical adenopathy.     Upper Body:     Right upper body: No supraclavicular or axillary adenopathy.     Left upper body: No supraclavicular or axillary adenopathy.     Lower Body: No right inguinal adenopathy. No left inguinal adenopathy.  Skin:    General: Skin is warm and dry.     Findings: No rash.     Comments: Exposed areas only. Skin tone appropriate for ethnicity.   Neurological:     Mental Status: She is alert and oriented to person, place, and time.     GCS: GCS eye subscore is 4. GCS verbal subscore is 5. GCS motor subscore is 6.  Psychiatric:        Attention and Perception: Attention normal.        Mood and Affect: Mood and affect normal.        Speech: Speech normal.        Behavior: Behavior normal. Behavior is cooperative.        Thought Content: Thought content normal.     Assessment and Plan:  Sheila Bailey is a 22 y.o.  female presenting to the [facility] Department for STI screening  1. Screening for venereal disease STI tests offered and accepted by patient per protocol for asymptomatic patients. - Chlamydia/Gonorrhea [place] [facility] - HIV [place] [facility] - Syphilis Serology, [place] [facility] - WET PREP FOR TRICH, YEAST, CLUE - Gonococcus culture  2. Anxiety Patient reported anxiety. Requests information on Kathreen Cosier, LCSW. Card given and patient encouraged to contact to schedule an appointment. Patient accepted and agreed with plan.   Patient accepted all screenings including oral, vaginal CT/GC and bloodwork for  HIV/RPR, and wet prep. Patient meets criteria for HepB screening? No. Ordered? not applicable Patient meets criteria for HepC screening? No. Ordered? not applicable  Treat wet prep per standing order Discussed time line for [facility] results and that patient will be called with positive results and encouraged patient to call if she had not heard in 2 weeks.  Counseled to return or seek care for continued or worsening symptoms Recommended repeat testing in 3 months with positive results. Recommended condom use with all sex  Patient is currently using  abstinence  to prevent pregnancy.    Return if symptoms worsen or fail to improve.  No future appointments.  Total time with patient 30 minutes.   Edmonia James, NP

## 2023-01-02 NOTE — Progress Notes (Unsigned)
Pt is here for STD screening.  Wet mount results reviewed, no treatment required per SO.  Condoms declined.  Trista Ciocca M Mickeal Daws, RN  

## 2023-01-05 NOTE — Progress Notes (Signed)

## 2023-01-07 LAB — GONOCOCCUS CULTURE

## 2023-03-26 ENCOUNTER — Ambulatory Visit: Payer: BC Managed Care – PPO

## 2023-03-26 DIAGNOSIS — Z719 Counseling, unspecified: Secondary | ICD-10-CM

## 2023-03-26 NOTE — Progress Notes (Signed)
Pt seen in nurse clinic requesting Hep B vaccine. 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  Pt declined Flu and Covid vaccine. M.Raman Featherston, LPN.

## 2023-03-30 ENCOUNTER — Other Ambulatory Visit: Payer: BC Managed Care – PPO

## 2023-04-07 ENCOUNTER — Ambulatory Visit (LOCAL_COMMUNITY_HEALTH_CENTER): Payer: Self-pay

## 2023-04-07 DIAGNOSIS — Z111 Encounter for screening for respiratory tuberculosis: Secondary | ICD-10-CM

## 2023-04-10 ENCOUNTER — Ambulatory Visit: Payer: BC Managed Care – PPO

## 2023-04-10 DIAGNOSIS — Z111 Encounter for screening for respiratory tuberculosis: Secondary | ICD-10-CM

## 2023-04-10 LAB — TB SKIN TEST
Induration: 0 mm
TB Skin Test: NEGATIVE

## 2023-05-14 ENCOUNTER — Ambulatory Visit

## 2023-05-14 DIAGNOSIS — Z23 Encounter for immunization: Secondary | ICD-10-CM

## 2023-05-14 DIAGNOSIS — Z719 Counseling, unspecified: Secondary | ICD-10-CM

## 2023-05-14 NOTE — Progress Notes (Signed)
 Pt in nurse clinic requesting Flu vaccine. Eligible per [facility], given VIS. Administered vaccine, tolerated well. Given [facility] copy, explained and understood. M.Rhayne Chatwin, LPN.
